# Patient Record
Sex: Female | Born: 1980 | Race: Black or African American | Hispanic: No | Marital: Married | Smoking: Never smoker
Health system: Southern US, Community
[De-identification: ages and names within clinical notes are randomized; demographics above are authoritative.]

## PROBLEM LIST (undated history)

## (undated) DIAGNOSIS — D649 Anemia, unspecified: Secondary | ICD-10-CM

## (undated) DIAGNOSIS — Z9889 Other specified postprocedural states: Secondary | ICD-10-CM

## (undated) DIAGNOSIS — I10 Essential (primary) hypertension: Secondary | ICD-10-CM

## (undated) DIAGNOSIS — T8859XA Other complications of anesthesia, initial encounter: Secondary | ICD-10-CM

## (undated) DIAGNOSIS — D259 Leiomyoma of uterus, unspecified: Secondary | ICD-10-CM

## (undated) DIAGNOSIS — R519 Headache, unspecified: Secondary | ICD-10-CM

## (undated) DIAGNOSIS — Z973 Presence of spectacles and contact lenses: Secondary | ICD-10-CM

## (undated) HISTORY — PX: MYOMECTOMY: SHX85

## (undated) HISTORY — PX: TONSILLECTOMY: SUR1361

---
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
## 2000-02-10 ENCOUNTER — Inpatient Hospital Stay (HOSPITAL_COMMUNITY): Admission: AD | Admit: 2000-02-10 | Discharge: 2000-02-10 | Payer: Self-pay | Admitting: *Deleted

## 2000-03-20 ENCOUNTER — Emergency Department (HOSPITAL_COMMUNITY): Admission: EM | Admit: 2000-03-20 | Discharge: 2000-03-20 | Payer: Self-pay | Admitting: Emergency Medicine

## 2000-04-17 ENCOUNTER — Emergency Department (HOSPITAL_COMMUNITY): Admission: EM | Admit: 2000-04-17 | Discharge: 2000-04-17 | Payer: Self-pay | Admitting: Emergency Medicine

## 2000-04-17 ENCOUNTER — Encounter: Payer: Self-pay | Admitting: Emergency Medicine

## 2000-11-23 ENCOUNTER — Emergency Department (HOSPITAL_COMMUNITY): Admission: EM | Admit: 2000-11-23 | Discharge: 2000-11-23 | Payer: Self-pay | Admitting: Emergency Medicine

## 2000-12-28 ENCOUNTER — Emergency Department (HOSPITAL_COMMUNITY): Admission: EM | Admit: 2000-12-28 | Discharge: 2000-12-28 | Payer: Self-pay | Admitting: *Deleted

## 2001-04-03 ENCOUNTER — Other Ambulatory Visit: Admission: RE | Admit: 2001-04-03 | Discharge: 2001-04-03 | Payer: Self-pay | Admitting: *Deleted

## 2001-09-01 ENCOUNTER — Emergency Department (HOSPITAL_COMMUNITY): Admission: EM | Admit: 2001-09-01 | Discharge: 2001-09-02 | Payer: Self-pay | Admitting: Emergency Medicine

## 2001-09-02 ENCOUNTER — Encounter: Payer: Self-pay | Admitting: Emergency Medicine

## 2001-12-20 ENCOUNTER — Emergency Department (HOSPITAL_COMMUNITY): Admission: EM | Admit: 2001-12-20 | Discharge: 2001-12-20 | Payer: Self-pay | Admitting: Emergency Medicine

## 2002-04-04 ENCOUNTER — Inpatient Hospital Stay (HOSPITAL_COMMUNITY): Admission: AD | Admit: 2002-04-04 | Discharge: 2002-04-04 | Payer: Self-pay | Admitting: Obstetrics and Gynecology

## 2002-12-04 ENCOUNTER — Encounter: Payer: Self-pay | Admitting: Obstetrics & Gynecology

## 2002-12-04 ENCOUNTER — Ambulatory Visit (HOSPITAL_COMMUNITY): Admission: RE | Admit: 2002-12-04 | Discharge: 2002-12-04 | Payer: Self-pay | Admitting: Obstetrics & Gynecology

## 2003-02-16 ENCOUNTER — Emergency Department (HOSPITAL_COMMUNITY): Admission: EM | Admit: 2003-02-16 | Discharge: 2003-02-16 | Payer: Self-pay | Admitting: Emergency Medicine

## 2003-04-11 ENCOUNTER — Emergency Department (HOSPITAL_COMMUNITY): Admission: EM | Admit: 2003-04-11 | Discharge: 2003-04-11 | Payer: Self-pay | Admitting: Emergency Medicine

## 2003-10-28 ENCOUNTER — Emergency Department (HOSPITAL_COMMUNITY): Admission: EM | Admit: 2003-10-28 | Discharge: 2003-10-28 | Payer: Self-pay | Admitting: Emergency Medicine

## 2004-01-20 ENCOUNTER — Emergency Department (HOSPITAL_COMMUNITY): Admission: EM | Admit: 2004-01-20 | Discharge: 2004-01-20 | Payer: Self-pay | Admitting: Emergency Medicine

## 2004-07-19 ENCOUNTER — Emergency Department (HOSPITAL_COMMUNITY): Admission: EM | Admit: 2004-07-19 | Discharge: 2004-07-19 | Payer: Self-pay | Admitting: Emergency Medicine

## 2004-09-02 ENCOUNTER — Emergency Department (HOSPITAL_COMMUNITY): Admission: EM | Admit: 2004-09-02 | Discharge: 2004-09-02 | Payer: Self-pay | Admitting: Emergency Medicine

## 2004-09-12 ENCOUNTER — Emergency Department (HOSPITAL_COMMUNITY): Admission: EM | Admit: 2004-09-12 | Discharge: 2004-09-12 | Payer: Self-pay | Admitting: Emergency Medicine

## 2004-11-22 ENCOUNTER — Emergency Department (HOSPITAL_COMMUNITY): Admission: EM | Admit: 2004-11-22 | Discharge: 2004-11-23 | Payer: Self-pay | Admitting: *Deleted

## 2005-03-01 ENCOUNTER — Emergency Department (HOSPITAL_COMMUNITY): Admission: EM | Admit: 2005-03-01 | Discharge: 2005-03-01 | Payer: Self-pay | Admitting: Emergency Medicine

## 2005-07-18 ENCOUNTER — Emergency Department (HOSPITAL_COMMUNITY): Admission: EM | Admit: 2005-07-18 | Discharge: 2005-07-18 | Payer: Self-pay | Admitting: Emergency Medicine

## 2005-07-25 ENCOUNTER — Emergency Department (HOSPITAL_COMMUNITY): Admission: EM | Admit: 2005-07-25 | Discharge: 2005-07-26 | Payer: Self-pay | Admitting: Emergency Medicine

## 2005-08-08 ENCOUNTER — Emergency Department (HOSPITAL_COMMUNITY): Admission: EM | Admit: 2005-08-08 | Discharge: 2005-08-08 | Payer: Self-pay | Admitting: Emergency Medicine

## 2008-07-31 HISTORY — PX: TONSILLECTOMY: SUR1361

## 2010-07-31 HISTORY — PX: MYOMECTOMY: SHX85

## 2012-11-03 DIAGNOSIS — R531 Weakness: Secondary | ICD-10-CM

## 2012-11-03 HISTORY — DX: Weakness: R53.1

## 2013-10-13 HISTORY — PX: SALPINGECTOMY: SHX328

## 2015-12-29 ENCOUNTER — Ambulatory Visit
Admission: RE | Admit: 2015-12-29 | Discharge: 2015-12-29 | Disposition: A | Discharge status: Home / Self Care | Payer: Self-pay | Source: Ambulatory Visit | Attending: Oncology | Admitting: Oncology

## 2015-12-29 ENCOUNTER — Ambulatory Visit: Payer: Self-pay | Attending: Oncology

## 2015-12-29 VITALS — BP 162/120 | HR 77 | Temp 99.1°F | Resp 20 | Ht 68.11 in | Wt 287.8 lb

## 2015-12-29 DIAGNOSIS — N63 Unspecified lump in unspecified breast: Secondary | ICD-10-CM
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
## 2016-02-17 ENCOUNTER — Other Ambulatory Visit: Payer: Self-pay

## 2016-02-17 DIAGNOSIS — N63 Unspecified lump in unspecified breast: Secondary | ICD-10-CM

## 2016-02-18 NOTE — Progress Notes (Signed)
Scheduled patient for 6 month follow-up ultrasound of left breast for Monday 12/4.2017 at 9:20 a.m.  Mailed notification to patient.  Copy to [facility].

## 2016-06-28 ENCOUNTER — Ambulatory Visit: Payer: Self-pay | Attending: Oncology

## 2016-07-03 ENCOUNTER — Other Ambulatory Visit: Payer: Self-pay | Admitting: *Deleted

## 2016-07-03 ENCOUNTER — Ambulatory Visit
Admission: RE | Admit: 2016-07-03 | Discharge: 2016-07-03 | Disposition: A | Discharge status: Home / Self Care | Payer: Self-pay | Source: Ambulatory Visit | Attending: Internal Medicine | Admitting: Internal Medicine

## 2016-07-03 DIAGNOSIS — N63 Unspecified lump in unspecified breast: Secondary | ICD-10-CM

## 2016-07-06 ENCOUNTER — Encounter: Payer: Self-pay | Admitting: *Deleted

## 2016-07-06 NOTE — Progress Notes (Signed)
Patient with birads 3 ultrasound.  She is currently [redacted] weeks pregnant.  Letter mailed to patient to inform her of the need to return in 6 months for another ultrasound of the left breast.  I have scheduled her to return on 01/01/17 @ 8:00.  [facility] to [place].

## 2016-07-31 HISTORY — PX: TUBAL LIGATION: SHX77

## 2017-01-01 ENCOUNTER — Ambulatory Visit: Payer: Self-pay

## 2017-01-15 ENCOUNTER — Ambulatory Visit: Payer: Self-pay | Attending: Oncology
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
## 2018-07-31 DIAGNOSIS — U071 COVID-19: Secondary | ICD-10-CM

## 2018-07-31 HISTORY — DX: COVID-19: U07.1

## 2020-12-29 HISTORY — PX: CHOLECYSTECTOMY, LAPAROSCOPIC: SHX56

## 2021-09-19 DIAGNOSIS — S61419A Laceration without foreign body of unspecified hand, initial encounter: Secondary | ICD-10-CM

## 2021-09-19 HISTORY — DX: Laceration without foreign body of unspecified hand, initial encounter: S61.419A

## 2021-10-26 DIAGNOSIS — T148XXA Other injury of unspecified body region, initial encounter: Secondary | ICD-10-CM

## 2021-10-26 HISTORY — DX: Other injury of unspecified body region, initial encounter: T14.8XXA

## 2021-11-01 ENCOUNTER — Encounter (HOSPITAL_BASED_OUTPATIENT_CLINIC_OR_DEPARTMENT_OTHER): Payer: Self-pay | Admitting: Obstetrics and Gynecology

## 2021-11-02 ENCOUNTER — Encounter (HOSPITAL_BASED_OUTPATIENT_CLINIC_OR_DEPARTMENT_OTHER): Payer: Self-pay | Admitting: Obstetrics and Gynecology

## 2021-11-02 ENCOUNTER — Other Ambulatory Visit: Payer: Self-pay

## 2021-11-02 NOTE — Progress Notes (Signed)
? ? Your procedure is scheduled on Tuesday, 11/22/2021. ? Report to [facility] ? Call this number if you have problems the morning of surgery  :[phone]. ? ? [place].  WE ARE LOCATED IN THE [facility]. ? ?PLEASE BRING YOUR INSURANCE CARD AND PHOTO ID DAY OF SURGERY. ? ?ONLY 2 PEOPLE ARE ALLOWED IN  WAITING  ROOM.  ?                                   ? REMEMBER: ? DO NOT EAT FOOD, CANDY GUM OR MINTS  AFTER MIDNIGHT THE NIGHT BEFORE YOUR SURGERY . YOU MAY HAVE CLEAR LIQUIDS FROM MIDNIGHT THE NIGHT BEFORE YOUR SURGERY UNTIL  4:30 am. NO CLEAR LIQUIDS AFTER   4:30 am DAY OF SURGERY. ? ?YOU MAY  BRUSH YOUR TEETH MORNING OF SURGERY AND RINSE YOUR MOUTH OUT, NO CHEWING GUM CANDY OR MINTS. ? ? ? ? ?CLEAR LIQUID DIET ? ? ?Foods Allowed                                                                     Foods Excluded ? ?Coffee and tea, regular and decaf                             liquids that you cannot  ?Plain Jell-O                                                                   see through such as: ?Fruit ices (not with fruit pulp)                                     milk, soups, orange juice  ?Plain  Popsicles                                    All solid food ?Carbonated beverages, regular and diet                                    ?Cranberry, grape and apple juices ?Sports drinks like Gatorade ? ?Sample Menu ?Breakfast                                Lunch                                     Supper ?Cranberry juice                                           ?  Jell-O                                     Grape juice                           Apple juice ?Coffee or tea                        Jell-O                                      Popsicle ?                                               Coffee or tea                        Coffee or tea ? ?_____________________________________________________________________ ?  ? ? TAKE THESE MEDICATIONS  MORNING OF SURGERY: NONE ? ? ? UP TO 4 VISITORS  MAY VISIT IN THE EXTENDED RECOVERY ROOM UNTIL 800 PM ONLY.  1 VISITOR AGE 63 AND OVER MAY SPEND THE NIGHT AND MUST BE IN EXTENDED RECOVERY ROOM NO LATER THAN 800 PM . YOUR DISCHARGE TIME AFTER YOU SPEND THE NIGHT IS 900 AM THE MORNING AFTER YOUR SURGERY. ? ?YOU MAY PACK A SMALL OVERNIGHT BAG WITH TOILETRIES FOR YOUR OVERNIGHT STAY IF YOU WISH. ? ?YOUR PRESCRIPTION MEDICATIONS WILL BE PROVIDED DURING [place]. ? ? ?                                   ?DO NOT WEAR JEWERLY, MAKE UP. ?DO NOT WEAR LOTIONS, POWDERS, PERFUMES OR NAIL POLISH ON YOUR FINGERNAILS. TOENAIL POLISH IS OK TO WEAR. ?DO NOT SHAVE FOR 48 HOURS PRIOR TO DAY OF SURGERY. ?MEN MAY SHAVE FACE AND NECK. ?CONTACTS, GLASSES, OR DENTURES MAY NOT BE WORN TO SURGERY. ? ?REMEMBER: NO SMOKING, DRUGS OR ALCOHOL FOR 24 HOURS BEFORE YOUR SURGERY. ?                                   ?[facility] IS NOT RESPONSIBLE  FOR ANY BELONGINGS.                                  ?                                  . ?          [facility] - Preparing for Surgery ?Before surgery, you can play an important role.  Because skin is not sterile, your skin needs to be as free of germs as possible.  You can reduce the number of germs on your skin by washing with CHG (chlorahexidine gluconate) soap before surgery.  CHG is an antiseptic cleaner which kills germs and bonds with the skin to continue killing  germs even after washing. ?Please DO NOT use if you have an allergy to CHG or antibacterial soaps.  If your skin becomes reddened/irritated stop using the CHG and inform your nurse when you arrive at Short Stay. ?Do not shave (including legs and underarms) for at least 48 hours prior to the first CHG shower.  You may shave your face/neck. ?Please follow these instructions carefully: ? 1.  Shower with CHG Soap the night before surgery and the  morning of Surgery. ? 2.  If you choose to wash your hair, wash your hair first as usual  with your  normal  shampoo. ? 3.  After you shampoo, rinse your hair and body thoroughly to remove the  shampoo.                            ?4.  Use CHG as you would any other liquid soap.  You can apply chg directly  to the skin and wash  ?                    Gently with a scrungie or clean washcloth. ? 5.  Apply the CHG Soap to your body ONLY FROM THE NECK DOWN.   Do not use on face/ open      ?                     Wound or open sores. Avoid contact with eyes, ears mouth and genitals (private parts).  ?                     Production manager,  Genitals (private parts) with your normal soap. ?            6.  Wash thoroughly, paying special attention to the area where your surgery  will be performed. ? 7.  Thoroughly rinse your body with warm water from the neck down. ? 8.  DO NOT shower/wash with your normal soap after using and rinsing off  the CHG Soap. ?               9.  Pat yourself dry with a clean towel. ?           10.  Wear clean pajamas. ?           11.  Place clean sheets on your bed the night of your first shower and do not  sleep with pets. ?Day of Surgery : ?Do not apply any lotions/deodorants the morning of surgery.  Please wear clean clothes to the hospital/surgery center. ? ?IF YOU HAVE ANY SKIN IRRITATION OR PROBLEMS WITH THE SURGICAL SOAP, PLEASE GET A BAR OF GOLD DIAL SOAP AND SHOWER THE NIGHT BEFORE YOUR SURGERY AND THE MORNING OF YOUR SURGERY. PLEASE LET THE NURSE KNOW MORNING OF YOUR SURGERY IF YOU HAD ANY PROBLEMS WITH THE SURGICAL SOAP. ? ? ?________________________________________________________________________                  ?                                    ?  QUESTIONS CALL Shakeila Pfarr PRE OP NURSE PHONE [phone].                                    ?

## 2021-11-02 NOTE — Progress Notes (Signed)
Spoke w/ via phone for pre-op interview---[place] ?Lab needs dos---- urine pregnancy POCT per anesthesia, surgeon orders pending as of 11/02/2021              ?Lab results------11/18/21 lab appt for CBC, BMP, type & screen, EKG, 06/12/21 Echo w/bubble study in chart &  Care Everywhere ?COVID test -----patient states asymptomatic no test needed ?Arrive at -------0530 on Tuesday, 11/22/21 ?NPO after MN NO Solid Food.  Clear liquids from MN until---0430 ?Med rec completed ?Medications to take morning of surgery -----none ?Diabetic medication -----n/a ?Patient instructed no nail polish to be worn day of surgery ?Patient instructed to bring photo id and insurance card day of surgery ?Patient aware to have Driver (ride ) / caregiver    for 24 hours after surgery - husband, Aaron Edelman ?Patient Special Instructions -----Extended recovery instructions given. ?Pre-Op special Istructions -----Requested orders from Dr. Terri Piedra via Epic IB on 11/01/21. ?Patient verbalized understanding of instructions that were given at this phone interview. ?Patient denies shortness of breath, chest pain, fever, cough at this phone interview.  ?

## 2021-11-09 NOTE — Progress Notes (Signed)
Received a surgical clearance for pt via fax from Dr Terri Piedra office, placed w/ chart. ?

## 2021-11-18 ENCOUNTER — Other Ambulatory Visit: Payer: Self-pay

## 2021-11-18 ENCOUNTER — Encounter (HOSPITAL_COMMUNITY)
Admission: RE | Admit: 2021-11-18 | Discharge: 2021-11-18 | Disposition: A | Discharge status: Home / Self Care | Payer: BC Managed Care – PPO | Source: Ambulatory Visit | Attending: Obstetrics and Gynecology | Admitting: Obstetrics and Gynecology

## 2021-11-18 DIAGNOSIS — Z01818 Encounter for other preprocedural examination: Secondary | ICD-10-CM | POA: Insufficient documentation

## 2021-11-18 LAB — CBC
HCT: 32.2 % — ABNORMAL LOW (ref 36.0–46.0)
Hemoglobin: 8.8 g/dL — ABNORMAL LOW (ref 12.0–15.0)
MCH: 16.2 pg — ABNORMAL LOW (ref 26.0–34.0)
MCHC: 27.3 g/dL — ABNORMAL LOW (ref 30.0–36.0)
MCV: 59.3 fL — ABNORMAL LOW (ref 80.0–100.0)
Platelets: 529 10*3/uL — ABNORMAL HIGH (ref 150–400)
RBC: 5.43 MIL/uL — ABNORMAL HIGH (ref 3.87–5.11)
RDW: 22.7 % — ABNORMAL HIGH (ref 11.5–15.5)
WBC: 8 10*3/uL (ref 4.0–10.5)
nRBC: 0 % (ref 0.0–0.2)

## 2021-11-18 LAB — BASIC METABOLIC PANEL
Anion gap: 7 (ref 5–15)
BUN: 9 mg/dL (ref 6–20)
CO2: 26 mmol/L (ref 22–32)
Calcium: 9 mg/dL (ref 8.9–10.3)
Chloride: 105 mmol/L (ref 98–111)
Creatinine, Ser: 0.82 mg/dL (ref 0.44–1.00)
GFR, Estimated: >60 mL/min (ref >60)
Glucose, Bld: 83 mg/dL (ref 70–99)
Potassium: 3.7 mmol/L (ref 3.5–5.1)
Sodium: 138 mmol/L (ref 135–145)

## 2021-11-18 NOTE — Progress Notes (Signed)
11/18/21 Hgb 8.8 routed to Dr. Terri Piedra via [place]. ?

## 2021-11-18 NOTE — Progress Notes (Signed)
Patient came in for PAT appointment. BP was 173/117 via machine. It was rechecked about 10 minutes later manually - 152/104.  ?I called Dr. Ivor Costa office. The office was closed. I left a message for  the on call doctor. I gave them the above information and let them know that the patient was scheduled for a hysterectomy on 11/22/21 with Dr. Terri Piedra. ? ?Anderson Malta from PAT stated that she let Janett Billow the PA in PAT know. Janett Billow told the patient to call her PCP  and let them know BP readings, then monitor at home. ? ?Olean Ree called back from [place] [facility] and stated that she had called the patient and left her a message to do the same (let PCP know and monitor BP.) ? ? ?

## 2021-11-21 ENCOUNTER — Encounter (HOSPITAL_BASED_OUTPATIENT_CLINIC_OR_DEPARTMENT_OTHER): Payer: Self-pay | Admitting: Obstetrics and Gynecology

## 2021-11-21 NOTE — Progress Notes (Signed)
Patient called in and let me know that she saw her PCP on Saturday due to her BP running high when she was here for her PAT appointment. Her PCP took her off Amlodipine, and put her on Valsartan-HCTZ 160-25 mg. Patient stated that her BP was already doing much better. She said that this morning it was 131/60. Patient also let me know that she forgot to tell me that she has nausea and vomiting after anesthesia.  ?

## 2021-11-22 ENCOUNTER — Other Ambulatory Visit: Payer: Self-pay

## 2021-11-22 ENCOUNTER — Ambulatory Visit (HOSPITAL_BASED_OUTPATIENT_CLINIC_OR_DEPARTMENT_OTHER)
Admission: RE | Admit: 2021-11-22 | Discharge: 2021-11-22 | Disposition: A | Discharge status: Home / Self Care | Payer: BC Managed Care – PPO | Source: Ambulatory Visit | Attending: Obstetrics and Gynecology | Admitting: Obstetrics and Gynecology

## 2021-11-22 ENCOUNTER — Inpatient Hospital Stay (HOSPITAL_BASED_OUTPATIENT_CLINIC_OR_DEPARTMENT_OTHER): Payer: BC Managed Care – PPO | Admitting: Physician Assistant

## 2021-11-22 ENCOUNTER — Encounter (HOSPITAL_BASED_OUTPATIENT_CLINIC_OR_DEPARTMENT_OTHER): Payer: Self-pay | Admitting: Obstetrics and Gynecology

## 2021-11-22 ENCOUNTER — Inpatient Hospital Stay (HOSPITAL_BASED_OUTPATIENT_CLINIC_OR_DEPARTMENT_OTHER): Payer: BC Managed Care – PPO | Admitting: Anesthesiology

## 2021-11-22 ENCOUNTER — Encounter (HOSPITAL_BASED_OUTPATIENT_CLINIC_OR_DEPARTMENT_OTHER)
Admission: RE | Disposition: A | Discharge status: Home / Self Care | Payer: Self-pay | Source: Ambulatory Visit | Attending: Obstetrics and Gynecology

## 2021-11-22 DIAGNOSIS — N8000 Endometriosis of the uterus, unspecified: Secondary | ICD-10-CM | POA: Insufficient documentation

## 2021-11-22 DIAGNOSIS — D500 Iron deficiency anemia secondary to blood loss (chronic): Secondary | ICD-10-CM | POA: Diagnosis not present

## 2021-11-22 DIAGNOSIS — I10 Essential (primary) hypertension: Secondary | ICD-10-CM | POA: Insufficient documentation

## 2021-11-22 DIAGNOSIS — Z9889 Other specified postprocedural states: Secondary | ICD-10-CM | POA: Diagnosis present

## 2021-11-22 DIAGNOSIS — N888 Other specified noninflammatory disorders of cervix uteri: Secondary | ICD-10-CM | POA: Insufficient documentation

## 2021-11-22 DIAGNOSIS — D259 Leiomyoma of uterus, unspecified: Secondary | ICD-10-CM | POA: Diagnosis not present

## 2021-11-22 DIAGNOSIS — N921 Excessive and frequent menstruation with irregular cycle: Principal | ICD-10-CM | POA: Insufficient documentation

## 2021-11-22 DIAGNOSIS — Z01818 Encounter for other preprocedural examination: Principal | ICD-10-CM

## 2021-11-22 HISTORY — DX: Headache, unspecified: R51.9

## 2021-11-22 HISTORY — DX: Other complications of anesthesia, initial encounter: T88.59XA

## 2021-11-22 HISTORY — PX: CYSTOSCOPY: SHX5120

## 2021-11-22 HISTORY — DX: Other specified postprocedural states: Z98.890

## 2021-11-22 HISTORY — PX: TOTAL LAPAROSCOPIC HYSTERECTOMY WITH SALPINGECTOMY: SHX6742

## 2021-11-22 HISTORY — DX: Anemia, unspecified: D64.9

## 2021-11-22 HISTORY — DX: Leiomyoma of uterus, unspecified: D25.9

## 2021-11-22 HISTORY — DX: Essential (primary) hypertension: I10

## 2021-11-22 HISTORY — DX: Presence of spectacles and contact lenses: Z97.3

## 2021-11-22 LAB — POCT I-STAT, CHEM 8
BUN: 11 mg/dL (ref 6–20)
Calcium, Ion: 1.21 mmol/L (ref 1.15–1.40)
Chloride: 100 mmol/L (ref 98–111)
Creatinine, Ser: 1 mg/dL (ref 0.44–1.00)
Glucose, Bld: 116 mg/dL — ABNORMAL HIGH (ref 70–99)
HCT: 37 % (ref 36.0–46.0)
Hemoglobin: 12.6 g/dL (ref 12.0–15.0)
Potassium: 3.9 mmol/L (ref 3.5–5.1)
Sodium: 137 mmol/L (ref 135–145)
TCO2: 27 mmol/L (ref 22–32)

## 2021-11-22 LAB — CBC
HCT: 36.2 % (ref 36.0–46.0)
Hemoglobin: 9.8 g/dL — ABNORMAL LOW (ref 12.0–15.0)
MCH: 15.9 pg — ABNORMAL LOW (ref 26.0–34.0)
MCHC: 27.1 g/dL — ABNORMAL LOW (ref 30.0–36.0)
MCV: 58.6 fL — ABNORMAL LOW (ref 80.0–100.0)
Platelets: 595 10*3/uL — ABNORMAL HIGH (ref 150–400)
RBC: 6.18 MIL/uL — ABNORMAL HIGH (ref 3.87–5.11)
RDW: 23.2 % — ABNORMAL HIGH (ref 11.5–15.5)
WBC: 7.2 10*3/uL (ref 4.0–10.5)
nRBC: 0 % (ref 0.0–0.2)

## 2021-11-22 LAB — POCT PREGNANCY, URINE: Preg Test, Ur: NEGATIVE

## 2021-11-22 LAB — TYPE AND SCREEN
ABO/RH(D): B POS
Antibody Screen: NEGATIVE

## 2021-11-22 LAB — ABO/RH: ABO/RH(D): B POS

## 2021-11-22 SURGERY — HYSTERECTOMY, TOTAL, LAPAROSCOPIC, WITH SALPINGECTOMY
Anesthesia: General | Site: Urethra

## 2021-11-22 MED ORDER — ACETAMINOPHEN 500 MG PO TABS
ORAL_TABLET | ORAL | Status: AC
  Filled 2021-11-22: qty 2

## 2021-11-22 MED ORDER — VALSARTAN-HYDROCHLOROTHIAZIDE 160-25 MG PO TABS: 1.0000 | ORAL_TABLET | Freq: Every evening | ORAL | Status: DC

## 2021-11-22 MED ORDER — LIDOCAINE 2% (20 MG/ML) 5 ML SYRINGE
INTRAMUSCULAR | Status: DC | PRN
  Administered 2021-11-22: 100 mg via INTRAVENOUS

## 2021-11-22 MED ORDER — OXYCODONE-ACETAMINOPHEN 5-325 MG PO TABS: 1.0000 | ORAL_TABLET | ORAL | 0 refills | Status: DC | PRN

## 2021-11-22 MED ORDER — OXYCODONE HCL 5 MG PO TABS
5.0000 mg | ORAL_TABLET | ORAL | Status: DC | PRN
  Administered 2021-11-22: 10 mg via ORAL
  Administered 2021-11-22: 5 mg via ORAL

## 2021-11-22 MED ORDER — FERROUS SULFATE 325 (65 FE) MG PO TABS
325.0000 mg | ORAL_TABLET | Freq: Every day | ORAL | Status: DC
  Filled 2021-11-22: qty 1

## 2021-11-22 MED ORDER — TRANEXAMIC ACID-NACL 1000-0.7 MG/100ML-% IV SOLN
INTRAVENOUS | Status: AC
  Filled 2021-11-22: qty 100

## 2021-11-22 MED ORDER — DOCUSATE SODIUM 100 MG PO CAPS
100.0000 mg | ORAL_CAPSULE | Freq: Two times a day (BID) | ORAL | Status: DC
  Administered 2021-11-22: 100 mg via ORAL

## 2021-11-22 MED ORDER — ACETAMINOPHEN 500 MG PO TABS
1000.0000 mg | ORAL_TABLET | ORAL | Status: AC
  Administered 2021-11-22: 1000 mg via ORAL

## 2021-11-22 MED ORDER — OXYCODONE HCL 5 MG PO TABS: 5.0000 mg | ORAL_TABLET | Freq: Once | ORAL | Status: DC | PRN

## 2021-11-22 MED ORDER — LACTATED RINGERS IV SOLN: INTRAVENOUS | Status: DC

## 2021-11-22 MED ORDER — ONDANSETRON HCL 4 MG/2ML IJ SOLN
INTRAMUSCULAR | Status: AC
  Filled 2021-11-22: qty 2

## 2021-11-22 MED ORDER — ONDANSETRON 4 MG PO TBDP: 4.0000 mg | ORAL_TABLET | Freq: Three times a day (TID) | ORAL | 0 refills | Status: AC | PRN

## 2021-11-22 MED ORDER — ROCURONIUM BROMIDE 10 MG/ML (PF) SYRINGE
PREFILLED_SYRINGE | INTRAVENOUS | Status: AC
  Filled 2021-11-22: qty 10

## 2021-11-22 MED ORDER — ACETAMINOPHEN 500 MG PO TABS
ORAL_TABLET | ORAL | Status: AC
  Filled 2021-11-22: qty 1

## 2021-11-22 MED ORDER — IBUPROFEN 200 MG PO TABS
600.0000 mg | ORAL_TABLET | Freq: Four times a day (QID) | ORAL | Status: DC
  Administered 2021-11-22: 600 mg via ORAL

## 2021-11-22 MED ORDER — IBUPROFEN 600 MG PO TABS: 600.0000 mg | ORAL_TABLET | Freq: Four times a day (QID) | ORAL | 2 refills | Status: AC | PRN

## 2021-11-22 MED ORDER — TRANEXAMIC ACID-NACL 1000-0.7 MG/100ML-% IV SOLN
1000.0000 mg | Freq: Once | INTRAVENOUS | Status: AC
  Administered 2021-11-22: 1000 mg via INTRAVENOUS

## 2021-11-22 MED ORDER — IRON (FERROUS SULFATE) 325 (65 FE) MG PO TABS: 1.0000 | ORAL_TABLET | Freq: Every day | ORAL | 1 refills | Status: AC

## 2021-11-22 MED ORDER — PHENYLEPHRINE 80 MCG/ML (10ML) SYRINGE FOR IV PUSH (FOR BLOOD PRESSURE SUPPORT)
PREFILLED_SYRINGE | INTRAVENOUS | Status: DC | PRN
  Administered 2021-11-22 (×3): 80 ug via INTRAVENOUS
  Administered 2021-11-22: 160 ug via INTRAVENOUS
  Administered 2021-11-22: 40 ug via INTRAVENOUS
  Administered 2021-11-22: 160 ug via INTRAVENOUS
  Administered 2021-11-22: 200 ug via INTRAVENOUS
  Administered 2021-11-22 (×2): 80 ug via INTRAVENOUS

## 2021-11-22 MED ORDER — ONDANSETRON HCL 4 MG/2ML IJ SOLN
INTRAMUSCULAR | Status: DC | PRN
  Administered 2021-11-22: 4 mg via INTRAVENOUS

## 2021-11-22 MED ORDER — MENTHOL 3 MG MT LOZG: 1.0000 | LOZENGE | OROMUCOSAL | Status: DC | PRN

## 2021-11-22 MED ORDER — ARTIFICIAL TEARS OPHTHALMIC OINT
TOPICAL_OINTMENT | OPHTHALMIC | Status: AC
  Filled 2021-11-22: qty 3.5

## 2021-11-22 MED ORDER — POVIDONE-IODINE 10 % EX SWAB: 2.0000 "application " | Freq: Once | CUTANEOUS | Status: DC

## 2021-11-22 MED ORDER — ACETAMINOPHEN 500 MG PO TABS
1000.0000 mg | ORAL_TABLET | Freq: Four times a day (QID) | ORAL | Status: DC
  Administered 2021-11-22 (×2): 1000 mg via ORAL

## 2021-11-22 MED ORDER — EPHEDRINE 5 MG/ML INJ
INTRAVENOUS | Status: AC
  Filled 2021-11-22: qty 5

## 2021-11-22 MED ORDER — IRBESARTAN 150 MG PO TABS
150.0000 mg | ORAL_TABLET | Freq: Every day | ORAL | Status: DC
Start: 2021-11-22 — End: 2021-11-23
  Filled 2021-11-22: qty 1

## 2021-11-22 MED ORDER — KETOROLAC TROMETHAMINE 30 MG/ML IJ SOLN: 30.0000 mg | Freq: Once | INTRAMUSCULAR | Status: DC | PRN

## 2021-11-22 MED ORDER — ONDANSETRON HCL 4 MG/2ML IJ SOLN: 4.0000 mg | Freq: Once | INTRAMUSCULAR | Status: DC | PRN

## 2021-11-22 MED ORDER — SODIUM CHLORIDE 0.9 % IR SOLN
Status: DC | PRN
  Administered 2021-11-22 (×2): 1000 mL

## 2021-11-22 MED ORDER — BUPIVACAINE HCL (PF) 0.25 % IJ SOLN
INTRAMUSCULAR | Status: DC | PRN
  Administered 2021-11-22: 14 mL

## 2021-11-22 MED ORDER — MIDAZOLAM HCL 2 MG/2ML IJ SOLN
INTRAMUSCULAR | Status: AC
  Filled 2021-11-22: qty 2

## 2021-11-22 MED ORDER — SPIRONOLACTONE 25 MG PO TABS
50.0000 mg | ORAL_TABLET | Freq: Every day | ORAL | Status: DC
  Filled 2021-11-22: qty 2

## 2021-11-22 MED ORDER — POLYETHYLENE GLYCOL 3350 17 G PO PACK: 17.0000 g | PACK | Freq: Every day | ORAL | Status: DC | PRN

## 2021-11-22 MED ORDER — HYDROCHLOROTHIAZIDE 25 MG PO TABS
25.0000 mg | ORAL_TABLET | Freq: Every day | ORAL | Status: DC
  Filled 2021-11-22: qty 1

## 2021-11-22 MED ORDER — PROPOFOL 1000 MG/100ML IV EMUL
INTRAVENOUS | Status: AC
  Filled 2021-11-22: qty 100

## 2021-11-22 MED ORDER — OXYCODONE HCL 5 MG/5ML PO SOLN: 5.0000 mg | Freq: Once | ORAL | Status: DC | PRN

## 2021-11-22 MED ORDER — FENTANYL CITRATE (PF) 100 MCG/2ML IJ SOLN
INTRAMUSCULAR | Status: DC | PRN
  Administered 2021-11-22: 100 ug via INTRAVENOUS

## 2021-11-22 MED ORDER — SUCCINYLCHOLINE CHLORIDE 200 MG/10ML IV SOSY
PREFILLED_SYRINGE | INTRAVENOUS | Status: DC | PRN
Start: 2021-11-22 — End: 2021-11-22
  Administered 2021-11-22: 160 mg via INTRAVENOUS

## 2021-11-22 MED ORDER — HYDROMORPHONE HCL 1 MG/ML IJ SOLN
0.2500 mg | INTRAMUSCULAR | Status: DC | PRN
  Administered 2021-11-22 (×3): 0.25 mg via INTRAVENOUS

## 2021-11-22 MED ORDER — OXYCODONE HCL 5 MG PO TABS
ORAL_TABLET | ORAL | Status: AC
  Filled 2021-11-22: qty 2

## 2021-11-22 MED ORDER — KETAMINE HCL 10 MG/ML IJ SOLN
INTRAMUSCULAR | Status: DC | PRN
Start: 2021-11-22 — End: 2021-11-22
  Administered 2021-11-22: 40 mg via INTRAVENOUS

## 2021-11-22 MED ORDER — PHENYLEPHRINE 80 MCG/ML (10ML) SYRINGE FOR IV PUSH (FOR BLOOD PRESSURE SUPPORT)
PREFILLED_SYRINGE | INTRAVENOUS | Status: AC
  Filled 2021-11-22: qty 20

## 2021-11-22 MED ORDER — SIMETHICONE 80 MG PO CHEW: 80.0000 mg | CHEWABLE_TABLET | Freq: Four times a day (QID) | ORAL | Status: DC | PRN

## 2021-11-22 MED ORDER — PROPOFOL 10 MG/ML IV BOLUS
INTRAVENOUS | Status: DC | PRN
  Administered 2021-11-22: 50 ug/kg/min via INTRAVENOUS
  Administered 2021-11-22: 25 ug/kg/min via INTRAVENOUS
  Administered 2021-11-22: 200 mg via INTRAVENOUS

## 2021-11-22 MED ORDER — LIDOCAINE HCL (PF) 2 % IJ SOLN
INTRAMUSCULAR | Status: AC
Start: 2021-11-22 — End: ?
  Filled 2021-11-22: qty 5

## 2021-11-22 MED ORDER — ONDANSETRON HCL 4 MG/2ML IJ SOLN
4.0000 mg | Freq: Four times a day (QID) | INTRAMUSCULAR | Status: DC | PRN
  Administered 2021-11-22: 4 mg via INTRAVENOUS

## 2021-11-22 MED ORDER — SCOPOLAMINE 1 MG/3DAYS TD PT72
MEDICATED_PATCH | TRANSDERMAL | Status: DC | PRN
  Administered 2021-11-22: 1 via TRANSDERMAL

## 2021-11-22 MED ORDER — SODIUM CHLORIDE 0.9 % IV SOLN
INTRAVENOUS | Status: AC
  Filled 2021-11-22: qty 2

## 2021-11-22 MED ORDER — KETOROLAC TROMETHAMINE 30 MG/ML IJ SOLN
INTRAMUSCULAR | Status: DC | PRN
  Administered 2021-11-22: 30 mg via INTRAVENOUS

## 2021-11-22 MED ORDER — OXYCODONE-ACETAMINOPHEN 5-325 MG PO TABS: 1.0000 | ORAL_TABLET | ORAL | 0 refills | Status: AC | PRN

## 2021-11-22 MED ORDER — PROPOFOL 10 MG/ML IV BOLUS
INTRAVENOUS | Status: AC
  Filled 2021-11-22: qty 20

## 2021-11-22 MED ORDER — SCOPOLAMINE 1 MG/3DAYS TD PT72
MEDICATED_PATCH | TRANSDERMAL | Status: AC
  Filled 2021-11-22: qty 1

## 2021-11-22 MED ORDER — HYDROMORPHONE HCL 1 MG/ML IJ SOLN
INTRAMUSCULAR | Status: AC
  Filled 2021-11-22: qty 1

## 2021-11-22 MED ORDER — ROCURONIUM BROMIDE 10 MG/ML (PF) SYRINGE
PREFILLED_SYRINGE | INTRAVENOUS | Status: DC | PRN
  Administered 2021-11-22: 25 mg via INTRAVENOUS
  Administered 2021-11-22: 60 mg via INTRAVENOUS

## 2021-11-22 MED ORDER — IRON (FERROUS SULFATE) 325 (65 FE) MG PO TABS: 1.0000 | ORAL_TABLET | Freq: Every day | ORAL | 1 refills | Status: DC

## 2021-11-22 MED ORDER — DEXAMETHASONE SODIUM PHOSPHATE 10 MG/ML IJ SOLN
INTRAMUSCULAR | Status: DC | PRN
  Administered 2021-11-22: 10 mg via INTRAVENOUS

## 2021-11-22 MED ORDER — PHENYLEPHRINE 80 MCG/ML (10ML) SYRINGE FOR IV PUSH (FOR BLOOD PRESSURE SUPPORT)
PREFILLED_SYRINGE | INTRAVENOUS | Status: AC
  Filled 2021-11-22: qty 10

## 2021-11-22 MED ORDER — KETAMINE HCL 50 MG/5ML IJ SOSY
PREFILLED_SYRINGE | INTRAMUSCULAR | Status: AC
  Filled 2021-11-22: qty 5

## 2021-11-22 MED ORDER — ONDANSETRON HCL 4 MG PO TABS: 4.0000 mg | ORAL_TABLET | Freq: Four times a day (QID) | ORAL | Status: DC | PRN

## 2021-11-22 MED ORDER — EPHEDRINE SULFATE-NACL 50-0.9 MG/10ML-% IV SOSY
PREFILLED_SYRINGE | INTRAVENOUS | Status: DC | PRN
  Administered 2021-11-22: 10 mg via INTRAVENOUS
  Administered 2021-11-22: 15 mg via INTRAVENOUS

## 2021-11-22 MED ORDER — OXYCODONE HCL 5 MG PO TABS
ORAL_TABLET | ORAL | Status: AC
  Filled 2021-11-22: qty 1

## 2021-11-22 MED ORDER — SODIUM CHLORIDE 0.9 % IV SOLN
2.0000 g | INTRAVENOUS | Status: AC
  Administered 2021-11-22: 2 g via INTRAVENOUS

## 2021-11-22 MED ORDER — IBUPROFEN 200 MG PO TABS
ORAL_TABLET | ORAL | Status: AC
  Filled 2021-11-22: qty 3

## 2021-11-22 MED ORDER — IBUPROFEN 600 MG PO TABS: 600.0000 mg | ORAL_TABLET | Freq: Four times a day (QID) | ORAL | Status: DC | PRN

## 2021-11-22 MED ORDER — KETOROLAC TROMETHAMINE 30 MG/ML IJ SOLN: 30.0000 mg | Freq: Once | INTRAMUSCULAR | Status: DC

## 2021-11-22 MED ORDER — KETOROLAC TROMETHAMINE 30 MG/ML IJ SOLN
INTRAMUSCULAR | Status: AC
  Filled 2021-11-22: qty 1

## 2021-11-22 MED ORDER — MIDAZOLAM HCL 2 MG/2ML IJ SOLN
INTRAMUSCULAR | Status: DC | PRN
  Administered 2021-11-22: 2 mg via INTRAVENOUS

## 2021-11-22 MED ORDER — DEXAMETHASONE SODIUM PHOSPHATE 10 MG/ML IJ SOLN
INTRAMUSCULAR | Status: AC
  Filled 2021-11-22: qty 1

## 2021-11-22 MED ORDER — FENTANYL CITRATE (PF) 250 MCG/5ML IJ SOLN
INTRAMUSCULAR | Status: AC
  Filled 2021-11-22: qty 5

## 2021-11-22 MED ORDER — 0.9 % SODIUM CHLORIDE (POUR BTL) OPTIME
TOPICAL | Status: DC | PRN
  Administered 2021-11-22: 500 mL

## 2021-11-22 MED ORDER — DOCUSATE SODIUM 100 MG PO CAPS
ORAL_CAPSULE | ORAL | Status: AC
  Filled 2021-11-22: qty 1

## 2021-11-22 MED ORDER — SUGAMMADEX SODIUM 200 MG/2ML IV SOLN
INTRAVENOUS | Status: DC | PRN
  Administered 2021-11-22: 100 mg via INTRAVENOUS
  Administered 2021-11-22: 200 mg via INTRAVENOUS

## 2021-11-22 SURGICAL SUPPLY — 41 items
ADH SKN CLS APL DERMABOND .7 (GAUZE/BANDAGES/DRESSINGS) ×2
COVER BACK TABLE 60X90IN (DRAPES) ×3 IMPLANT
COVER MAYO STAND STRL (DRAPES) ×3 IMPLANT
COVER SURGICAL LIGHT HANDLE (MISCELLANEOUS) ×3 IMPLANT
DERMABOND ADVANCED (GAUZE/BANDAGES/DRESSINGS) ×1
DERMABOND ADVANCED .7 DNX12 (GAUZE/BANDAGES/DRESSINGS) ×2 IMPLANT
DEVICE SUTURE ENDOST 10MM (ENDOMECHANICALS) ×1 IMPLANT
DURAPREP 26ML APPLICATOR (WOUND CARE) ×4 IMPLANT
GAUZE 4X4 16PLY ~~LOC~~+RFID DBL (SPONGE) ×7 IMPLANT
GLOVE BIO SURGEON STRL SZ 6.5 (GLOVE) ×9 IMPLANT
GLOVE BIOGEL PI IND STRL 7.0 (GLOVE) IMPLANT
GLOVE BIOGEL PI IND STRL 7.5 (GLOVE) IMPLANT
GLOVE BIOGEL PI INDICATOR 7.0 (GLOVE) ×4
GLOVE BIOGEL PI INDICATOR 7.5 (GLOVE) ×2
GOWN STRL REUS W/TWL LRG LVL3 (GOWN DISPOSABLE) ×10 IMPLANT
HIBICLENS CHG 4% 4OZ (MISCELLANEOUS) ×2 IMPLANT
IV NS 1000ML (IV SOLUTION) ×6
IV NS 1000ML BAXH (IV SOLUTION) IMPLANT
KIT TURNOVER CYSTO (KITS) ×3 IMPLANT
MANIFOLD NEPTUNE II (INSTRUMENTS) ×1 IMPLANT
NS IRRIG 500ML POUR BTL (IV SOLUTION) ×1 IMPLANT
OCCLUDER COLPOPNEUMO (BALLOONS) ×3 IMPLANT
PACK LAPAROSCOPY BASIN (CUSTOM PROCEDURE TRAY) ×3 IMPLANT
PACK TRENDGUARD 450 HYBRID PRO (MISCELLANEOUS) IMPLANT
SET IRRIG Y TYPE TUR BLADDER L (SET/KITS/TRAYS/PACK) ×1 IMPLANT
SET SUCTION IRRIG HYDROSURG (IRRIGATION / IRRIGATOR) ×3 IMPLANT
SET TRI-LUMEN FLTR TB AIRSEAL (TUBING) ×3 IMPLANT
SHEARS HARMONIC ACE PLUS 36CM (ENDOMECHANICALS) ×1 IMPLANT
SUT ENDO VLOC 180-0-8IN (SUTURE) ×1 IMPLANT
SUT VIC AB 4-0 PS2 18 (SUTURE) ×3 IMPLANT
SUT VICRYL 0 UR6 27IN ABS (SUTURE) ×3 IMPLANT
SYR 50ML LL SCALE MARK (SYRINGE) ×3 IMPLANT
SYSTEM CARTER THOMASON II (TROCAR) ×4 IMPLANT
TIP UTERINE 6.7X10CM GRN DISP (MISCELLANEOUS) ×1 IMPLANT
TOWEL OR 17X26 10 PK STRL BLUE (TOWEL DISPOSABLE) ×5 IMPLANT
TRAY FOLEY W/BAG SLVR 14FR LF (SET/KITS/TRAYS/PACK) ×3 IMPLANT
TRENDGUARD 450 HYBRID PRO PACK (MISCELLANEOUS) ×3
TROCAR BLADELESS OPT 5 100 (ENDOMECHANICALS) ×7 IMPLANT
TROCAR PORT AIRSEAL 5X120 (TROCAR) ×1 IMPLANT
TROCAR XCEL NON-BLD 11X100MML (ENDOMECHANICALS) ×3 IMPLANT
WARMER LAPAROSCOPE (MISCELLANEOUS) ×3 IMPLANT

## 2021-11-22 NOTE — H&P (Signed)
Katie Frazier is an 41 y.o. female with menometrorrhagia here for total laparoscopic hysterectomy, bilateral salpingectomy, cystoscopy, possible open. ?Pt reports heavy irregular menses. Uses tampons/pads q 2-3 hrs. Now anemic.  ?She has a history of HTN - now controlled, and endometriosis ? ?Pertinent Gynecological History: ?Menses:  irregular, heavy ?Bleeding: dysfunctional uterine bleeding ?Contraception: none ?DES exposure: denies ?Blood transfusions: none ?Sexually transmitted diseases: no past history ?Previous GYN Procedures:  none   ?Last mammogram: normal Date: 2023 ?Last pap: normal Date: 2022 ?OB History: G3, P1112  ? ?Menstrual History: ?Menarche age: 64 ?Patient's last menstrual period was 10/09/2021 (exact date). ?  ? ?Past Medical History:  ?Diagnosis Date  ? Anemia   ? Comminuted fracture 10/26/2021  ? left great toe distal phalanx comminuted fracture  ? Complication of anesthesia   ? COVID-19 2020  ? weakness, fatigue, loss of smell (no medical treatment needed per pt)  ? Headache   ? stress induced migraines  ? Hypertension   ? Laceration of hand 09/19/2021  ? left  ? Left-sided weakness 11/03/2012  ? thought to be due to complex migraine given normal MRI results  ? Left-sided weakness 06/11/2021  ? left-sided numbness and weakness with headache, work-up negative for stroke, patient states due to complex migranine on 11/02/2021  ? PONV (postoperative nausea and vomiting)   ? Uterine fibroid   ? Wears glasses   ? ? ?Past Surgical History:  ?Procedure Laterality Date  ? CESAREAN SECTION    ? 03/20/2015 & 08/15/2016  ? CHOLECYSTECTOMY, LAPAROSCOPIC  12/2020  ? MYOMECTOMY  2012  ? laparascopic  ? SALPINGECTOMY Right 10/13/2013  ? ectopic pregnancy  ? TONSILLECTOMY  2010  ? TUBAL LIGATION  2018  ? ? ?History reviewed. No pertinent family history. ? ?Social History:  reports that she has never smoked. She has never used smokeless tobacco. She reports that she does not use drugs. No history on file for  alcohol use. ? ?Allergies:  ?Allergies  ?Allergen Reactions  ? Ace Inhibitors Cough  ? Procardia [Nifedipine] Other (See Comments)  ?  Migraine headache  ? ? ?No medications prior to admission.  ? ? ?Review of Systems  ?Constitutional:  Positive for activity change.  ?Respiratory:  Negative for chest tightness and shortness of breath.   ?Cardiovascular:  Negative for chest pain and palpitations.  ?Gastrointestinal:  Positive for abdominal pain.  ?Genitourinary:  Positive for pelvic pain and vaginal bleeding.  ?Neurological:  Negative for headaches.  ?Psychiatric/Behavioral:  The patient is nervous/anxious.   ? ?Height '5\' 7"'$  (1.702 m), weight 122.5 kg, last menstrual period 10/09/2021. ?Physical Exam ?Constitutional:   ?   Appearance: Normal appearance. She is normal weight.  ?Cardiovascular:  ?   Pulses: Normal pulses.  ?Pulmonary:  ?   Effort: Pulmonary effort is normal.  ?Genitourinary: ?   General: Normal vulva.  ?Musculoskeletal:     ?   General: Normal range of motion.  ?   Cervical back: Normal range of motion.  ?Skin: ?   General: Skin is warm and dry.  ?   Capillary Refill: Capillary refill takes 2 to 3 seconds.  ?Neurological:  ?   General: No focal deficit present.  ?   Mental Status: She is alert and oriented to person, place, and time. Mental status is at baseline.  ?Psychiatric:     ?   Mood and Affect: Mood normal.     ?   Behavior: Behavior normal.     ?  Thought Content: Thought content normal.     ?   Judgment: Judgment normal.  ? ? ?No results found for this or any previous visit (from the past 24 hour(s)). ? ?No results found. ? ?Assessment/Plan: ?02BX [id number] with menometrorrhagia and fibroid uterus here for total laparoscopic hysterectomy, bilateral salpingectomy, cystoscopy, possible open ?- Admit ?- ERAS protocol ?-TXA preop to minimize anemia ?- Consider blood transfusion ?- Verify consent after reviewing risks/benefits ?- To OR when ready  ? ?Isaiah Serge ?11/22/2021, 4:59 AM ? ?

## 2021-11-22 NOTE — Transfer of Care (Signed)
Immediate Anesthesia Transfer of Care Note ? ?Patient: Katie Frazier ? ?Procedure(s) Performed: Procedure(s) (LRB): ?TOTAL LAPAROSCOPIC HYSTERECTOMY WITH LEFT SALPINGECTOMY (Left) ?CYSTOSCOPY (N/A) ? ?Patient Location: PACU ? ?Anesthesia Type: General ? ?Level of Consciousness: awake, alert  and oriented ? ?Airway & Oxygen Therapy: Patient Spontanous Breathing and Patient connected to face mask oxygen ? ?Post-op Assessment: Report given to PACU RN and Post -op Vital signs reviewed and stable ? ?Post vital signs: Reviewed and stable ? ?Complications: No apparent anesthesia complications ? ?Last Vitals:  ?Vitals Value Taken Time  ?BP 143/95 11/22/21 1010  ?Temp    ?Pulse 107 11/22/21 1015  ?Resp 19 11/22/21 1015  ?SpO2 100 % 11/22/21 1015  ?Vitals shown include unvalidated device data. ? ?Last Pain:  ?Vitals:  ? 11/22/21 0545  ?TempSrc: Oral  ?   ? ?Patients Stated Pain Goal: 5 (11/22/21 0606) ? ?Complications: No notable events documented. ?

## 2021-11-22 NOTE — Interval H&P Note (Signed)
History and Physical Interval Note: ?Difficulty with getting IV access  ? ?11/22/2021 ?7:28 AM ? ?Katie Frazier  has presented today for surgery, with the diagnosis of uterine leiomyoma.  The various methods of treatment have been discussed with the patient and family. After consideration of risks, benefits and other options for treatment, the patient has consented to  Procedure(s) with comments: ?TOTAL LAPAROSCOPIC HYSTERECTOMY WITH SALPINGECTOMY (Bilateral) - poss open ?CYSTOSCOPY (N/A) as a surgical intervention.  The patient's history has been reviewed, patient examined, no change in status, stable for surgery.  I have reviewed the patient's chart and labs.  Questions were answered to the patient's satisfaction.   ? ? ?Isaiah Serge ? ? ?

## 2021-11-22 NOTE — Interval H&P Note (Signed)
History and Physical Interval Note: ?No change since H/P done other than doing well on new BP medication. ?Consent and procedure verified  ?To OR when ready  ? ?11/22/2021 ?7:24 AM ? ?Genelle Bal  has presented today for surgery, with the diagnosis of uterine leiomyoma.  The various methods of treatment have been discussed with the patient and family. After consideration of risks, benefits and other options for treatment, the patient has consented to  Procedure(s) with comments: ?TOTAL LAPAROSCOPIC HYSTERECTOMY WITH SALPINGECTOMY (Bilateral) - poss open ?CYSTOSCOPY (N/A) as a surgical intervention.  The patient's history has been reviewed, patient examined, no change in status, stable for surgery.  I have reviewed the patient's chart and labs.  Questions were answered to the patient's satisfaction.   ? ? ?Isaiah Serge ? ? ?

## 2021-11-22 NOTE — Discharge Instructions (Signed)
Call office with any concerns [phone] 

## 2021-11-22 NOTE — Op Note (Signed)
Operative Note ? ? ? ?Preoperative Diagnosis ?Menometrorrhagia ?Fibroid uterus ?Anemia of chronic blood loss ?Endometriosis ?Prior cesarean section x 2 ? ? ?Postoperative Diagnosis: Same  ? ? ?Procedure: Total laparoscopic hysterectomy with left salpingectomy ( right absent from prior surgery), cystoscopy ? ? ?Surgeon: Mickle Mallory, DO  ?Assist: Paula Compton MD ? ?Anesthesia ? ?Fluids: LR 1431m ?EBL: 29m?UOP: 15036m ? ?Findings: Fibroid uterus, retroflexed. Grossly normal bilateral ovaries and left fallopian tube , absent right fallopian tube. Minimal adhesions of left tube to lateral sidewall.  ? ? ?Specimen: Uterus, cervix, left fallopian tube  ? ? ?Procedure Note  ?Pt seen in pre-op. Procedure reviewed and all questions answered; consent verified ? ?Pt was taken to the operating room. She was assessed and general anesthesia safely administered.  ?Pt was then placed in the dorsal lithotomy position with her arms safely positioned at her sides.  Pt was prepped and draped in sterile fashion and a timeout performed.  ?An exam under anesthesia noted a retroflexed uterus. A speculum was placed and the anterior lip of the cervix grasped with a single toothed tenaculum.  The uterus was sounded to 11cm so a size 10koh was assembled and placed with a small metal cup. Retention sutures were placed at 3 and 9 o'clock. A foley catheter then also placed in a sterile fashion ?Her legs were then lowered and attention turned to her abdomen.  ?Here a 5mm34mcision was made at the umbilicus after injecting with 0.25% plain marcaine. A 5mm 98miview trocar and camera were then placed with the abdomen tented upwards. The placement was confirmed to be appropriate pneumoperitoneum was attained with CO2 gas to 14mmH42mhe patient was placed in trendelenberg and gross survey of pelvis done.Gross survey noted: Fibroid uterus, retroflexed. Grossly normal bilateral ovaries and left fallopian tube , absent right fallopian tube. Minimal  adhesions of left tube to lateral sidewall.  ? ? ?At this time a 5mm ai4mal port was then placed under direct visualization in right lower quadrant and an 11mm po21mite in the left. Care was taken to avoid the epigastric vessels. Both ureters were visualized along lateral sidewalls. ? ?Starting on the the patients left, the left fallopian tube was then grasped, elevated and excised using the harmonic hemostatically. Next the utero-ovarian ligament and the round ligaments were sequentially grasped and excised. The broad ligament was then separated from the uterus sequentially and a bladder flap created.  ? ?The cardinal ligament was excised next at the utero-cervical junction and the ovarian vessel clamped, cauterized and cut. The same was done on the patients right by Dr RichardsMarvel Planmilar results.  ?Next the bladder reflection was dissected away. The vaginal occluder had been filled with 60cc of saline and was confirmed to maintain pneumoperitoneum. Thus,  starting anteriorly and working laterally, the uterus and cervix were amputated off the superior vagina. The uterus, cervix and fallopian tubes were removed vaginally ?The pelvis was irrigated and hemostasis noted. The angles of the vaginal cuff were easily seen thus using  a 0 vicryl endostitch  v-lock suture, the cuff was closed in a running fashion with 2-3 back stitches to effect closure.  ? ?Further irrigation of the pelvis confirmed no bleeding. The patient was  then flattened. The 11mm por60mte was closed with 0-vicryl suture using a carter thomasen device.  The remaining  trocars were removed under direct visualization and gas allowed to escape via the airseal being turned off. ?Next, a cystoscopy was performed. Ureteral jets  were easily noted from both sides.The cystoscopy was completed and foley catheter replaced. ? ?The patients incision sites were closed with 4-0 vicryl suture and dermabond.  ?All instrument and sponge counts were noted to be  accurate per nursing staff.  ?Patient was awakened and taken to recovery room in stable status.   ?

## 2021-11-22 NOTE — Anesthesia Preprocedure Evaluation (Signed)
Anesthesia Evaluation  ?Patient identified by MRN, date of birth, ID band ?Patient awake ? ? ? ?Reviewed: ?Allergy & Precautions, NPO status , Patient's Chart, lab work & pertinent test results ? ?History of Anesthesia Complications ?(+) PONV and history of anesthetic complications ? ?Airway ?Mallampati: II ? ?TM Distance: >3 FB ?Neck ROM: Full ? ? ? Dental ?no notable dental hx. ? ?  ?Pulmonary ?neg pulmonary ROS,  ?  ?Pulmonary exam normal ?breath sounds clear to auscultation ? ? ? ? ? ? Cardiovascular ?hypertension, Normal cardiovascular exam ?Rhythm:Regular Rate:Normal ? ? ?  ?Neuro/Psych ?negative neurological ROS ? negative psych ROS  ? GI/Hepatic ?negative GI ROS, Neg liver ROS,   ?Endo/Other  ?Morbid obesity ? Renal/GU ?negative Renal ROS  ?negative genitourinary ?  ?Musculoskeletal ?negative musculoskeletal ROS ?(+)  ? Abdominal ?  ?Peds ?negative pediatric ROS ?(+)  Hematology ? ?(+) Blood dyscrasia, anemia ,   ?Anesthesia Other Findings ? ? Reproductive/Obstetrics ?negative OB ROS ? ?  ? ? ? ? ? ? ? ? ? ? ? ? ? ?  ?  ? ? ? ? ? ? ? ? ?Anesthesia Physical ?Anesthesia Plan ? ?ASA: 3 ? ?Anesthesia Plan: General  ? ?Post-op Pain Management: Dilaudid IV  ? ?Induction: Intravenous ? ?PONV Risk Score and Plan: 4 or greater and Ondansetron, Dexamethasone, Midazolam, Scopolamine patch - Pre-op and Treatment may vary due to age or medical condition ? ?Airway Management Planned: Oral ETT ? ?Additional Equipment:  ? ?Intra-op Plan:  ? ?Post-operative Plan: Extubation in OR ? ?Informed Consent: I have reviewed the patients History and Physical, chart, labs and discussed the procedure including the risks, benefits and alternatives for the proposed anesthesia with the patient or authorized representative who has indicated his/her understanding and acceptance.  ? ? ? ?Dental advisory given ? ?Plan Discussed with: CRNA and Surgeon ? ?Anesthesia Plan Comments:   ? ? ? ? ? ? ?Anesthesia  Quick Evaluation ? ?

## 2021-11-22 NOTE — Anesthesia Procedure Notes (Signed)
Procedure Name: Intubation ?Date/Time: 11/22/2021 7:49 AM ?Performed by: Mechele Claude, CRNA ?Pre-anesthesia Checklist: Patient identified, Emergency Drugs available, Suction available and Patient being monitored ?Patient Re-evaluated:Patient Re-evaluated prior to induction ?Oxygen Delivery Method: Circle system utilized ?Preoxygenation: Pre-oxygenation with 100% oxygen ?Induction Type: IV induction and Cricoid Pressure applied ?Ventilation: Mask ventilation without difficulty ?Laryngoscope Size: Mac and 3 ?Grade View: Grade I ?Tube type: Oral ?Tube size: 7.0 mm ?Number of attempts: 1 ?Airway Equipment and Method: Stylet and Oral airway ?Placement Confirmation: ETT inserted through vocal cords under direct vision, positive ETCO2 and breath sounds checked- equal and bilateral ?Secured at: 21 cm ?Tube secured with: Tape ?Dental Injury: Teeth and Oropharynx as per pre-operative assessment  ? ? ? ? ?

## 2021-11-22 NOTE — Consult Note (Signed)
Pt seen post operatively. Husband at bedside with her. ?She reports voided well with foley out, ambulated in hallways, tolerated light meals through the day with no nausea - has scopolamine patch, pain well controlled. She denies dizziness, CP, SOB.  ?Requests discharge home  ? ?VSS: 127-147/79-82, 101, 14, 97.6 ?GEN - NAD ?ABD - soft, ND, incisions c/d/I ?EXT - no homans  ? ?A/P: POD#0 s/p TLH/BS and cystoscopy  ?        - Stable for discharge to home  ?        - Instructions reviewed; rxs sent ?        - Will plan post op visit in 2 and 6 wks ?

## 2021-11-22 NOTE — Anesthesia Postprocedure Evaluation (Signed)
Anesthesia Post Note ? ?Patient: ASSYRIA MORREALE ? ?Procedure(s) Performed: TOTAL LAPAROSCOPIC HYSTERECTOMY WITH LEFT SALPINGECTOMY (Left: Abdomen) ?CYSTOSCOPY (Urethra) ? ?  ? ?Patient location during evaluation: PACU ?Anesthesia Type: General ?Level of consciousness: awake and alert ?Pain management: pain level controlled ?Vital Signs Assessment: post-procedure vital signs reviewed and stable ?Respiratory status: spontaneous breathing, nonlabored ventilation, respiratory function stable and patient connected to nasal cannula oxygen ?Cardiovascular status: blood pressure returned to baseline and stable ?Postop Assessment: no apparent nausea or vomiting ?Anesthetic complications: no ? ? ?No notable events documented. ? ?Last Vitals:  ?Vitals:  ? 11/22/21 1130 11/22/21 1157  ?BP: 136/89 137/88  ?Pulse: (!) 101 100  ?Resp: 18 16  ?Temp: 36.8 ?C 36.8 ?C  ?SpO2: 100% 98%  ?  ?Last Pain:  ?Vitals:  ? 11/22/21 1130  ?TempSrc:   ?PainSc: 7   ? ? ?  ?  ?  ?  ?  ?  ? ?Sieara Bremer S ? ? ? ? ?

## 2021-11-23 LAB — SURGICAL PATHOLOGY

## 2021-11-24 ENCOUNTER — Encounter (HOSPITAL_BASED_OUTPATIENT_CLINIC_OR_DEPARTMENT_OTHER): Payer: Self-pay | Admitting: Obstetrics and Gynecology
